# Patient Record
Sex: Male | Born: 1965 | Race: White | Hispanic: No | Smoking: Never smoker
Health system: Southern US, Community
[De-identification: ages and names within clinical notes are randomized; demographics above are authoritative.]

## PROBLEM LIST (undated history)

## (undated) DIAGNOSIS — E7800 Pure hypercholesterolemia, unspecified: Secondary | ICD-10-CM

## (undated) DIAGNOSIS — E079 Disorder of thyroid, unspecified: Secondary | ICD-10-CM

## (undated) DIAGNOSIS — I10 Essential (primary) hypertension: Secondary | ICD-10-CM

## (undated) HISTORY — PX: HAND SURGERY: SHX662

## (undated) HISTORY — DX: Pure hypercholesterolemia, unspecified: E78.00

---

## 2002-09-22 ENCOUNTER — Encounter: Payer: Self-pay | Admitting: Emergency Medicine

## 2002-09-22 ENCOUNTER — Emergency Department (HOSPITAL_COMMUNITY): Admission: EM | Admit: 2002-09-22 | Discharge: 2002-09-22 | Payer: Self-pay | Admitting: Emergency Medicine

## 2004-07-07 ENCOUNTER — Ambulatory Visit (HOSPITAL_COMMUNITY): Admission: RE | Admit: 2004-07-07 | Discharge: 2004-07-07 | Payer: Self-pay | Admitting: Family Medicine

## 2004-11-16 ENCOUNTER — Emergency Department (HOSPITAL_COMMUNITY): Admission: EM | Admit: 2004-11-16 | Discharge: 2004-11-16 | Payer: Self-pay | Admitting: Emergency Medicine

## 2011-01-17 ENCOUNTER — Encounter: Payer: Self-pay | Admitting: Family Medicine

## 2013-06-19 ENCOUNTER — Other Ambulatory Visit (HOSPITAL_COMMUNITY): Payer: Self-pay | Admitting: Family Medicine

## 2013-06-19 ENCOUNTER — Ambulatory Visit (HOSPITAL_COMMUNITY)
Admission: RE | Admit: 2013-06-19 | Discharge: 2013-06-19 | Disposition: A | Discharge status: Home / Self Care | Payer: BC Managed Care – PPO | Source: Ambulatory Visit | Attending: Family Medicine | Admitting: Family Medicine

## 2013-06-19 DIAGNOSIS — E039 Hypothyroidism, unspecified: Secondary | ICD-10-CM

## 2013-06-19 DIAGNOSIS — R079 Chest pain, unspecified: Secondary | ICD-10-CM

## 2013-06-19 DIAGNOSIS — E785 Hyperlipidemia, unspecified: Secondary | ICD-10-CM

## 2013-06-19 DIAGNOSIS — I10 Essential (primary) hypertension: Secondary | ICD-10-CM

## 2013-07-20 ENCOUNTER — Other Ambulatory Visit: Payer: Self-pay | Admitting: *Deleted

## 2013-07-20 DIAGNOSIS — R079 Chest pain, unspecified: Secondary | ICD-10-CM

## 2013-07-20 DIAGNOSIS — I10 Essential (primary) hypertension: Secondary | ICD-10-CM

## 2013-07-24 ENCOUNTER — Telehealth (HOSPITAL_COMMUNITY): Payer: Self-pay | Admitting: Cardiovascular Disease

## 2013-07-26 ENCOUNTER — Encounter: Payer: Self-pay | Admitting: Cardiovascular Disease

## 2013-07-31 ENCOUNTER — Telehealth (HOSPITAL_COMMUNITY): Payer: Self-pay | Admitting: Cardiovascular Disease

## 2013-08-07 ENCOUNTER — Telehealth (HOSPITAL_COMMUNITY): Payer: Self-pay | Admitting: Cardiovascular Disease

## 2013-08-07 NOTE — Telephone Encounter (Signed)
Left message for patient to call back regarding scheduling testing  

## 2013-08-10 ENCOUNTER — Telehealth (HOSPITAL_COMMUNITY): Payer: Self-pay | Admitting: Cardiovascular Disease

## 2013-08-31 ENCOUNTER — Telehealth (HOSPITAL_COMMUNITY): Payer: Self-pay | Admitting: *Deleted

## 2013-09-04 ENCOUNTER — Encounter (HOSPITAL_COMMUNITY): Payer: Self-pay | Admitting: *Deleted

## 2013-09-27 ENCOUNTER — Telehealth (HOSPITAL_COMMUNITY): Payer: Self-pay | Admitting: *Deleted

## 2013-09-27 NOTE — Telephone Encounter (Signed)
[facility] LOC[facility] AT [facility] [address] [place], [place] [address] [phone]   Date:09/27/2013 Dear Dr. Epimenio Sarin  Our office has attempted to contact your patient Donald Perry  twice by telephone and we have also sent an appointment letter to schedule the 2D Echo and Stress test you ordered. The patient has not responded. We will not make any further attempts to contact the patient. If any further assistance is needed for this referral, please contact our office at [phone]  Sincerely, [facility] Scheduling Team

## 2013-10-22 ENCOUNTER — Other Ambulatory Visit (HOSPITAL_COMMUNITY): Payer: Self-pay | Admitting: Family Medicine

## 2013-10-22 DIAGNOSIS — R945 Abnormal results of liver function studies: Secondary | ICD-10-CM

## 2013-10-24 ENCOUNTER — Ambulatory Visit (HOSPITAL_COMMUNITY)
Admission: RE | Admit: 2013-10-24 | Discharge: 2013-10-24 | Disposition: A | Discharge status: Home / Self Care | Payer: BC Managed Care – PPO | Source: Ambulatory Visit | Attending: Family Medicine | Admitting: Family Medicine

## 2013-10-24 DIAGNOSIS — N289 Disorder of kidney and ureter, unspecified: Secondary | ICD-10-CM | POA: Insufficient documentation

## 2013-10-24 DIAGNOSIS — K7689 Other specified diseases of liver: Secondary | ICD-10-CM | POA: Insufficient documentation

## 2013-10-24 DIAGNOSIS — R945 Abnormal results of liver function studies: Secondary | ICD-10-CM

## 2013-10-24 MED ORDER — SODIUM CHLORIDE 0.9 % IJ SOLN
INTRAMUSCULAR | Status: AC
  Filled 2013-10-24: qty 550

## 2013-10-25 ENCOUNTER — Other Ambulatory Visit (HOSPITAL_COMMUNITY): Payer: Self-pay | Admitting: Family Medicine

## 2013-10-25 DIAGNOSIS — Q619 Cystic kidney disease, unspecified: Secondary | ICD-10-CM

## 2013-10-26 ENCOUNTER — Other Ambulatory Visit (HOSPITAL_COMMUNITY): Payer: Self-pay | Admitting: Family Medicine

## 2013-10-30 ENCOUNTER — Inpatient Hospital Stay (HOSPITAL_COMMUNITY): Admission: RE | Admit: 2013-10-30 | Payer: BC Managed Care – PPO | Source: Ambulatory Visit

## 2013-10-30 ENCOUNTER — Ambulatory Visit (HOSPITAL_COMMUNITY)
Admission: RE | Admit: 2013-10-30 | Discharge: 2013-10-30 | Disposition: A | Discharge status: Home / Self Care | Payer: BC Managed Care – PPO | Source: Ambulatory Visit | Attending: Family Medicine | Admitting: Family Medicine

## 2013-10-30 DIAGNOSIS — K7689 Other specified diseases of liver: Secondary | ICD-10-CM | POA: Insufficient documentation

## 2013-10-30 DIAGNOSIS — R160 Hepatomegaly, not elsewhere classified: Secondary | ICD-10-CM | POA: Insufficient documentation

## 2013-10-30 DIAGNOSIS — Q618 Other cystic kidney diseases: Secondary | ICD-10-CM | POA: Insufficient documentation

## 2013-10-30 DIAGNOSIS — Q619 Cystic kidney disease, unspecified: Secondary | ICD-10-CM

## 2013-10-30 LAB — POCT I-STAT CREATININE: Creatinine, Ser: 1.2 mg/dL (ref 0.50–1.35)

## 2013-10-30 MED ORDER — GADOBENATE DIMEGLUMINE 529 MG/ML IV SOLN
20.0000 mL | Freq: Once | INTRAVENOUS | Status: AC | PRN
  Administered 2013-10-30: 20 mL via INTRAVENOUS

## 2013-10-30 MED ORDER — SODIUM CHLORIDE 0.9 % IV SOLN
INTRAVENOUS | Status: AC
  Filled 2013-10-30: qty 150

## 2014-02-08 ENCOUNTER — Emergency Department (HOSPITAL_COMMUNITY): Payer: BC Managed Care – PPO

## 2014-02-08 ENCOUNTER — Encounter (HOSPITAL_COMMUNITY): Payer: Self-pay | Admitting: Emergency Medicine

## 2014-02-08 ENCOUNTER — Emergency Department (HOSPITAL_COMMUNITY)
Admission: EM | Admit: 2014-02-08 | Discharge: 2014-02-08 | Disposition: A | Discharge status: Home / Self Care | Payer: BC Managed Care – PPO | Attending: Emergency Medicine | Admitting: Emergency Medicine

## 2014-02-08 DIAGNOSIS — E079 Disorder of thyroid, unspecified: Secondary | ICD-10-CM | POA: Insufficient documentation

## 2014-02-08 DIAGNOSIS — I10 Essential (primary) hypertension: Secondary | ICD-10-CM | POA: Insufficient documentation

## 2014-02-08 DIAGNOSIS — Y92410 Unspecified street and highway as the place of occurrence of the external cause: Secondary | ICD-10-CM | POA: Insufficient documentation

## 2014-02-08 DIAGNOSIS — Z23 Encounter for immunization: Secondary | ICD-10-CM | POA: Insufficient documentation

## 2014-02-08 DIAGNOSIS — S01512A Laceration without foreign body of oral cavity, initial encounter: Secondary | ICD-10-CM

## 2014-02-08 DIAGNOSIS — Y9389 Activity, other specified: Secondary | ICD-10-CM | POA: Insufficient documentation

## 2014-02-08 DIAGNOSIS — S01511A Laceration without foreign body of lip, initial encounter: Secondary | ICD-10-CM

## 2014-02-08 DIAGNOSIS — Z79899 Other long term (current) drug therapy: Secondary | ICD-10-CM | POA: Insufficient documentation

## 2014-02-08 DIAGNOSIS — S01501A Unspecified open wound of lip, initial encounter: Secondary | ICD-10-CM | POA: Insufficient documentation

## 2014-02-08 HISTORY — DX: Essential (primary) hypertension: I10

## 2014-02-08 HISTORY — DX: Disorder of thyroid, unspecified: E07.9

## 2014-02-08 MED ORDER — AMOXICILLIN 250 MG PO CAPS
1000.0000 mg | ORAL_CAPSULE | Freq: Once | ORAL | Status: AC
  Administered 2014-02-08: 1000 mg via ORAL
  Filled 2014-02-08: qty 4

## 2014-02-08 MED ORDER — TETANUS-DIPHTH-ACELL PERTUSSIS 5-2.5-18.5 LF-MCG/0.5 IM SUSP
0.5000 mL | Freq: Once | INTRAMUSCULAR | Status: AC
Start: 2014-02-08 — End: 2014-02-08
  Administered 2014-02-08: 0.5 mL via INTRAMUSCULAR
  Filled 2014-02-08: qty 0.5

## 2014-02-08 MED ORDER — LIDOCAINE-EPINEPHRINE (PF) 1 %-1:200000 IJ SOLN
10.0000 mg | Freq: Once | INTRAMUSCULAR | Status: AC
  Administered 2014-02-08: 10 mg

## 2014-02-08 MED ORDER — AMOXICILLIN 500 MG PO CAPS
1000.0000 mg | ORAL_CAPSULE | Freq: Two times a day (BID) | ORAL | Status: DC
Start: 2014-02-08 — End: 2024-04-02

## 2014-02-08 NOTE — ED Notes (Signed)
Patient removed collar again but re-applied again
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
## 2014-02-08 NOTE — ED Notes (Signed)
Patient removed his neck collar  And states he wants to  Leave. Re-applied collar and informed him to leave it on and not to take it off. Stated he understood
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
## 2014-02-08 NOTE — Discharge Instructions (Signed)
The stitches on the outside need to be removed in 4-5 days. The stitches inside your mouth will dissolve.  Do not drive after you have been drinking!   Facial Laceration  A facial laceration is a cut on the face. These injuries can be painful and cause bleeding. Lacerations usually heal quickly, but they need special care to reduce scarring. DIAGNOSIS  Your health care provider will take a medical history, ask for details about how the injury occurred, and examine the wound to determine how deep the cut is. TREATMENT  Some facial lacerations may not require closure. Others may not be able to be closed because of an increased risk of infection. The risk of infection and the chance for successful closure will depend on various factors, including the amount of time since the injury occurred. The wound may be cleaned to help prevent infection. If closure is appropriate, pain medicines may be given if needed. Your health care provider will use stitches (sutures), wound glue (adhesive), or skin adhesive strips to repair the laceration. These tools bring the skin edges together to allow for faster healing and a better cosmetic outcome. If needed, you may also be given a tetanus shot. HOME CARE INSTRUCTIONS  Only take over-the-counter or prescription medicines as directed by your health care provider.  Follow your health care provider's instructions for wound care. These instructions will vary depending on the technique used for closing the wound. For Sutures:  Keep the wound clean and dry.   If you were given a bandage (dressing), you should change it at least once a day. Also change the dressing if it becomes wet or dirty, or as directed by your health care provider.   Wash the wound with soap and water 2 times a day. Rinse the wound off with water to remove all soap. Pat the wound dry with a clean towel.   After cleaning, apply a thin layer of the antibiotic ointment recommended by your health  care provider. This will help prevent infection and keep the dressing from sticking.   You may shower as usual after the first 24 hours. Do not soak the wound in water until the sutures are removed.   Get your sutures removed as directed by your health care provider. With facial lacerations, sutures should usually be taken out after 4 5 days to avoid stitch marks.   Wait a few days after your sutures are removed before applying any makeup. For Skin Adhesive Strips:  Keep the wound clean and dry.   Do not get the skin adhesive strips wet. You may bathe carefully, using caution to keep the wound dry.   If the wound gets wet, pat it dry with a clean towel.   Skin adhesive strips will fall off on their own. You may trim the strips as the wound heals. Do not remove skin adhesive strips that are still stuck to the wound. They will fall off in time.  For Wound Adhesive:  You may briefly wet your wound in the shower or bath. Do not soak or scrub the wound. Do not swim. Avoid periods of heavy sweating until the skin adhesive has fallen off on its own. After showering or bathing, gently pat the wound dry with a clean towel.   Do not apply liquid medicine, cream medicine, ointment medicine, or makeup to your wound while the skin adhesive is in place. This may loosen the film before your wound is healed.   If a dressing is placed  over the wound, be careful not to apply tape directly over the skin adhesive. This may cause the adhesive to be pulled off before the wound is healed.   Avoid prolonged exposure to sunlight or tanning lamps while the skin adhesive is in place.  The skin adhesive will usually remain in place for 5 10 days, then naturally fall off the skin. Do not pick at the adhesive film.  After Healing: Once the wound has healed, cover the wound with sunscreen during the day for 1 full year. This can help minimize scarring. Exposure to ultraviolet light in the first year will  darken the scar. It can take 1 2 years for the scar to lose its redness and to heal completely.  SEEK IMMEDIATE MEDICAL CARE IF:  You have redness, pain, or swelling around the wound.   You see ayellowish-white fluid (pus) coming from the wound.   You have chills or a fever.  MAKE SURE YOU:  Understand these instructions.  Will watch your condition.  Will get help right away if you are not doing well or get worse. Document Released: 01/20/2005 Document Revised: 10/03/2013 Document Reviewed: 07/26/2013 [facility] Patient Information 2014 [place], [place].  Mouth Laceration A mouth laceration is a cut inside the mouth. TREATMENT  Because of all the bacteria in the mouth, lacerations are usually not stitched (sutured) unless the wound is gaping open. Sometimes, a couple sutures may be placed just to hold the edges of the wound together and to speed healing. Over the next 1 to 2 days, you will see that the wound edges appear gray in color. The edges may appear ragged and slightly spread apart. Because of all the normal bacteria in the mouth, these wounds are contaminated, but this is not an infection that needs antibiotics. Most wounds heal with no problems despite their appearance. HOME CARE INSTRUCTIONS   Rinse your mouth with a warm, saltwater wash 4 to 6 times per day, or as your caregiver instructs.  Continue oral hygiene and gentle tooth brushing as normal, if possible.  Do not eat or drink hot food or beverages while your mouth is still numb.  Eat a bland diet to avoid irritation from acidic foods.  Only take over-the-counter or prescription medicines for pain, discomfort, or fever as directed by your caregiver.  Follow up with your caregiver as instructed. You may need to see your caregiver for a wound check in 48 to 72 hours to make sure your wound is healing.  If your laceration was sutured, do not play with the sutures or knots with your tongue. If you do this, they will  gradually loosen and may become untied. You may need a tetanus shot if:  You cannot remember when you had your last tetanus shot.  You have never had a tetanus shot. If you get a tetanus shot, your arm may swell, get red, and feel warm to the touch. This is common and not a problem. If you need a tetanus shot and you choose not to have one, there is a rare chance of getting tetanus. Sickness from tetanus can be serious. SEEK MEDICAL CARE IF:   You develop swelling or increasing pain in the wound or in other parts of your face.  You have a fever.  You develop swollen, tender glands in the throat.  You notice the wound edges do not stay together after your sutures have been removed.  You see pus coming from the wound. Some drainage in the mouth  is normal. MAKE SURE YOU:   Understand these instructions.  Will watch your condition.  Will get help right away if you are not doing well or get worse. Document Released: 12/13/2005 Document Revised: 03/06/2012 Document Reviewed: 06/17/2011 [facility] Patient Information 2014 [place], [place].  Tetanus, Diphtheria, Pertussis (Tdap) Vaccine What You Need to Know WHY GET VACCINATED? Tetanus, diphtheria and pertussis can be very serious diseases, even for adolescents and adults. Tdap vaccine can protect [place] from these diseases. TETANUS (Lockjaw) causes painful muscle tightening and stiffness, usually all over the body.  It can lead to tightening of muscles in the head and neck so you can't open your mouth, swallow, or sometimes even breathe. Tetanus kills about 1 out of 5 people who are infected. DIPHTHERIA can cause a thick coating to form in the back of the throat.  It can lead to breathing problems, paralysis, heart failure, and death. PERTUSSIS (Whooping Cough) causes severe coughing spells, which can cause difficulty breathing, vomiting and disturbed sleep.  It can also lead to weight loss, incontinence, and rib fractures. Up to 2 in 100  adolescents and 5 in 100 adults with pertussis are hospitalized or have complications, which could include pneumonia and death. These diseases are caused by bacteria. Diphtheria and pertussis are spread from person to person through coughing or sneezing. Tetanus enters the body through cuts, scratches, or wounds. Before vaccines, the [place] saw as many as 200,000 cases a year of diphtheria and pertussis, and hundreds of cases of tetanus. Since vaccination began, tetanus and diphtheria have dropped by about 99% and pertussis by about 80%. TDAP VACCINE Tdap vaccine can protect adolescents and adults from tetanus, diphtheria, and pertussis. One dose of Tdap is routinely given at age 82 or 23. People who did not get Tdap at that age should get it as soon as possible. Tdap is especially important for health care professionals and anyone having close contact with a baby younger than 12 months. Pregnant women should get a dose of Tdap during every pregnancy, to protect the newborn from pertussis. Infants are most at risk for severe, life-threatening complications from pertussis. A similar vaccine, called Td, protects from tetanus and diphtheria, but not pertussis. A Td booster should be given every 10 years. Tdap may be given as one of these boosters if you have not already gotten a dose. Tdap may also be given after a severe cut or burn to prevent tetanus infection. Your doctor can give you more information. Tdap may safely be given at the same time as other vaccines. SOME PEOPLE SHOULD NOT GET THIS VACCINE  If you ever had a life-threatening allergic reaction after a dose of any tetanus, diphtheria, or pertussis containing vaccine, OR if you have a severe allergy to any part of this vaccine, you should not get Tdap. Tell your doctor if you have any severe allergies.  If you had a coma, or long or multiple seizures within 7 days after a childhood dose of DTP or DTaP, you should not get Tdap, unless a  cause other than the vaccine was found. You can still get Td.  Talk to your doctor if you:  have epilepsy or another nervous system problem,  had severe pain or swelling after any vaccine containing diphtheria, tetanus or pertussis,  ever had Guillain-Barr Syndrome (GBS),  aren't feeling well on the day the shot is scheduled. RISKS OF A VACCINE REACTION With any medicine, including vaccines, there is a chance of side effects. These are usually  mild and go away on their own, but serious reactions are also possible. Brief fainting spells can follow a vaccination, leading to injuries from falling. Sitting or lying down for about 15 minutes can help prevent these. Tell your doctor if you feel dizzy or light-headed, or have vision changes or ringing in the ears. Mild problems following Tdap (Did not interfere with activities)  Pain where the shot was given (about 3 in 4 adolescents or 2 in 3 adults)  Redness or swelling where the shot was given (about 1 person in 5)  Mild fever of at least 100.21F (up to about 1 in 25 adolescents or 1 in 100 adults)  Headache (about 3 or 4 people in 10)  Tiredness (about 1 person in 3 or 4)  Nausea, vomiting, diarrhea, stomach ache (up to 1 in 4 adolescents or 1 in 10 adults)  Chills, body aches, sore joints, rash, swollen glands (uncommon) Moderate problems following Tdap (Interfered with activities, but did not require medical attention)  Pain where the shot was given (about 1 in 5 adolescents or 1 in 100 adults)  Redness or swelling where the shot was given (up to about 1 in 16 adolescents or 1 in 25 adults)  Fever over 102F (about 1 in 100 adolescents or 1 in 250 adults)  Headache (about 3 in 20 adolescents or 1 in 10 adults)  Nausea, vomiting, diarrhea, stomach ache (up to 1 or 3 people in 100)  Swelling of the entire arm where the shot was given (up to about 3 in 100). Severe problems following Tdap (Unable to perform usual activities,  required medical attention)  Swelling, severe pain, bleeding and redness in the arm where the shot was given (rare). A severe allergic reaction could occur after any vaccine (estimated less than 1 in a million doses). WHAT IF THERE IS A SERIOUS REACTION? What should I look for?  Look for anything that concerns you, such as signs of a severe allergic reaction, very high fever, or behavior changes. Signs of a severe allergic reaction can include hives, swelling of the face and throat, difficulty breathing, a fast heartbeat, dizziness, and weakness. These would start a few minutes to a few hours after the vaccination. What should I do?  If you think it is a severe allergic reaction or other emergency that can't wait, call 9-1-1 or get the person to the nearest hospital. Otherwise, call your doctor.  Afterward, the reaction should be reported to the "Vaccine Adverse Event Reporting System" (VAERS). Your doctor might file this report, or you can do it yourself through the VAERS web site at www.vaers.LAgents.no, or by calling 1-207-627-2941. VAERS is only for reporting reactions. They do not give medical advice.  THE NATIONAL VACCINE INJURY COMPENSATION PROGRAM The National Vaccine Injury Compensation Program (VICP) is a federal program that was created to compensate people who may have been injured by certain vaccines. Persons who believe they may have been injured by a vaccine can learn about the program and about filing a claim by calling 1-351-761-9474 or visiting the VICP website at SpiritualWord.at. HOW CAN I LEARN MORE?  Ask your doctor.  Call your local or state health department.  Contact the Centers for Disease Control and Prevention (CDC):  Call 901-084-1353 or visit CDC's website at PicCapture.uy. CDC Tdap Vaccine VIS (05/04/12) Document Released: 06/13/2012 Document Revised: 04/09/2013 Document Reviewed: 04/04/2013 [facility] Patient Information 2014 [place],  [place].  Amoxicillin capsules or tablets What is this medicine? AMOXICILLIN (a mox i SIL  in) is a penicillin antibiotic. It is used to treat certain kinds of bacterial infections. It will not work for colds, flu, or other viral infections. This medicine may be used for other purposes; ask your health care provider or pharmacist if you have questions. COMMON BRAND NAME(S): Amoxil, Moxilin , Sumox, Trimox What should I tell my health care provider before I take this medicine? They need to know if you have any of these conditions: -asthma -kidney disease -an unusual or allergic reaction to amoxicillin, other penicillins, cephalosporin antibiotics, other medicines, foods, dyes, or preservatives -pregnant or trying to get pregnant -breast-feeding How should I use this medicine? Take this medicine by mouth with a glass of water. Follow the directions on your prescription label. You may take this medicine with food or on an empty stomach. Take your medicine at regular intervals. Do not take your medicine more often than directed. Take all of your medicine as directed even if you think your are better. Do not skip doses or stop your medicine early. Talk to your pediatrician regarding the use of this medicine in children. While this drug may be prescribed for selected conditions, precautions do apply. Overdosage: If you think you have taken too much of this medicine contact a poison control center or emergency room at once. NOTE: This medicine is only for you. Do not share this medicine with others. What if I miss a dose? If you miss a dose, take it as soon as you can. If it is almost time for your next dose, take only that dose. Do not take double or extra doses. What may interact with this medicine? -amiloride -birth control pills -chloramphenicol -macrolides -probenecid -sulfonamides -tetracyclines This list may not describe all possible interactions. Give your health care provider a list of all  the medicines, herbs, non-prescription drugs, or dietary supplements you use. Also tell them if you smoke, drink alcohol, or use illegal drugs. Some items may interact with your medicine. What should I watch for while using this medicine? Tell your doctor or health care professional if your symptoms do not improve in 2 or 3 days. Take all of the doses of your medicine as directed. Do not skip doses or stop your medicine early. If you are diabetic, you may get a false positive result for sugar in your urine with certain brands of urine tests. Check with your doctor. Do not treat diarrhea with over-the-counter products. Contact your doctor if you have diarrhea that lasts more than 2 days or if the diarrhea is severe and watery. What side effects may I notice from receiving this medicine? Side effects that you should report to your doctor or health care professional as soon as possible: -allergic reactions like skin rash, itching or hives, swelling of the face, lips, or tongue -breathing problems -dark urine -redness, blistering, peeling or loosening of the skin, including inside the mouth -seizures -severe or watery diarrhea -trouble passing urine or change in the amount of urine -unusual bleeding or bruising -unusually weak or tired -yellowing of the eyes or skin Side effects that usually do not require medical attention (report to your doctor or health care professional if they continue or are bothersome): -dizziness -headache -stomach upset -trouble sleeping This list may not describe all possible side effects. Call your doctor for medical advice about side effects. You may report side effects to FDA at 1-800-FDA-1088. Where should I keep my medicine? Keep out of the reach of children. Store between 68 and 77 degrees F (  20 and 25 degrees C). Keep bottle closed tightly. Throw away any unused medicine after the expiration date. NOTE: This sheet is a summary. It may not cover all possible  information. If you have questions about this medicine, talk to your doctor, pharmacist, or health care provider.  2014, Elsevier/Gold Standard. (2008-03-05 14:10:59)

## 2014-02-08 NOTE — ED Notes (Signed)
Patient in room walking around and refused to remain in bed and removed his neck collar again. Attempted to get him back in bed and replace collar. He refused

## 2014-02-25 ENCOUNTER — Emergency Department (HOSPITAL_COMMUNITY)
Admission: EM | Admit: 2014-02-25 | Discharge: 2014-02-26 | Disposition: A | Discharge status: Home / Self Care | Payer: BC Managed Care – PPO | Attending: Emergency Medicine | Admitting: Emergency Medicine

## 2014-02-25 ENCOUNTER — Emergency Department (HOSPITAL_COMMUNITY): Payer: BC Managed Care – PPO

## 2014-02-25 ENCOUNTER — Encounter (HOSPITAL_COMMUNITY): Payer: Self-pay | Admitting: Emergency Medicine

## 2014-02-25 DIAGNOSIS — E079 Disorder of thyroid, unspecified: Secondary | ICD-10-CM | POA: Insufficient documentation

## 2014-02-25 DIAGNOSIS — M25461 Effusion, right knee: Secondary | ICD-10-CM

## 2014-02-25 DIAGNOSIS — S99919A Unspecified injury of unspecified ankle, initial encounter: Principal | ICD-10-CM

## 2014-02-25 DIAGNOSIS — Y92410 Unspecified street and highway as the place of occurrence of the external cause: Secondary | ICD-10-CM | POA: Insufficient documentation

## 2014-02-25 DIAGNOSIS — S99929A Unspecified injury of unspecified foot, initial encounter: Principal | ICD-10-CM

## 2014-02-25 DIAGNOSIS — Z79899 Other long term (current) drug therapy: Secondary | ICD-10-CM | POA: Insufficient documentation

## 2014-02-25 DIAGNOSIS — I10 Essential (primary) hypertension: Secondary | ICD-10-CM | POA: Insufficient documentation

## 2014-02-25 DIAGNOSIS — M25469 Effusion, unspecified knee: Secondary | ICD-10-CM | POA: Insufficient documentation

## 2014-02-25 DIAGNOSIS — Z792 Long term (current) use of antibiotics: Secondary | ICD-10-CM | POA: Insufficient documentation

## 2014-02-25 DIAGNOSIS — M25561 Pain in right knee: Secondary | ICD-10-CM

## 2014-02-25 DIAGNOSIS — S8990XA Unspecified injury of unspecified lower leg, initial encounter: Secondary | ICD-10-CM | POA: Insufficient documentation

## 2014-02-25 DIAGNOSIS — Y9389 Activity, other specified: Secondary | ICD-10-CM | POA: Insufficient documentation

## 2014-02-25 MED ORDER — OXYCODONE-ACETAMINOPHEN 5-325 MG PO TABS
1.0000 | ORAL_TABLET | Freq: Once | ORAL | Status: AC
  Administered 2014-02-25: 1 via ORAL
  Filled 2014-02-25: qty 1
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
## 2014-02-26 MED ORDER — OXYCODONE-ACETAMINOPHEN 5-325 MG PO TABS: 1.0000 | ORAL_TABLET | ORAL | Status: DC | PRN
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
## 2015-01-14 IMAGING — CR DG CHEST 2V
2 series · 2 of 2 positions shown · non-contrast
Comparison: 07/09/2004

CLINICAL DATA: Chest pain, hyperlipidemia, hypertension, left chest
pain under breast for 1 week

CHEST - 2 VIEW

[view not recorded (1 of 2)]
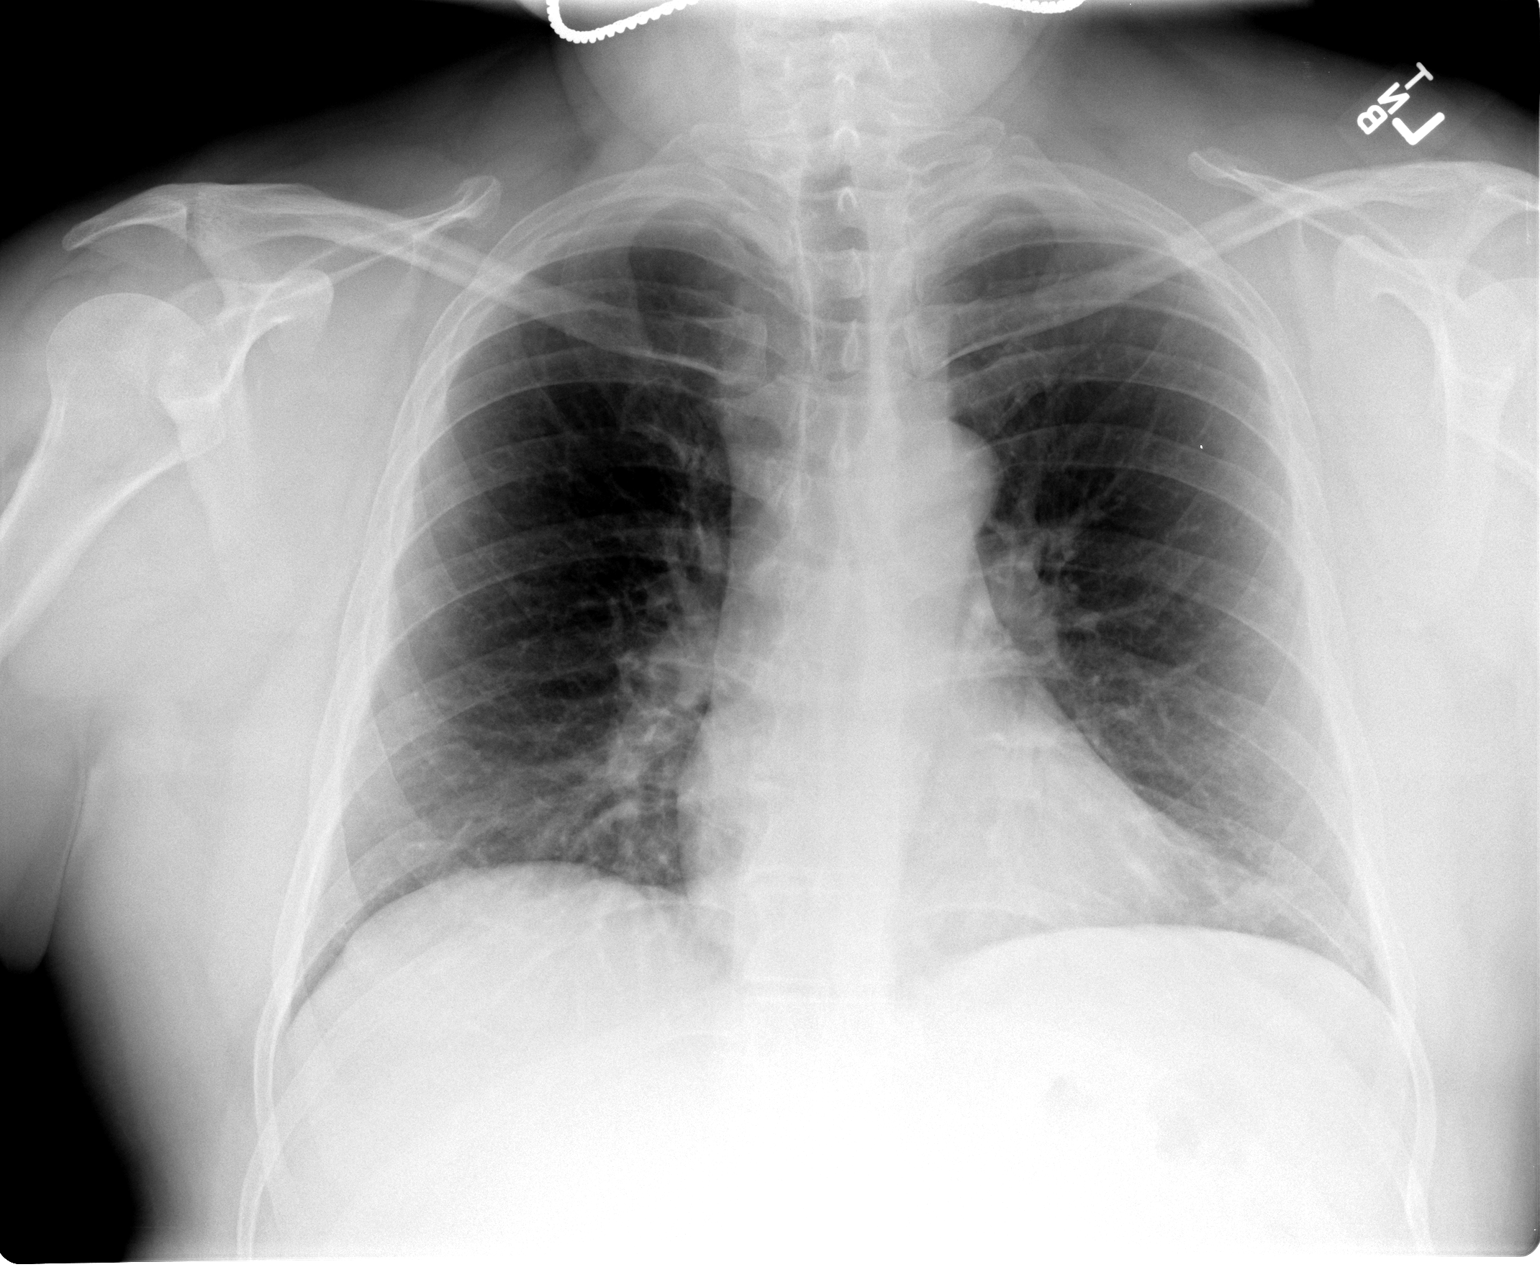

[view not recorded (2 of 2)]
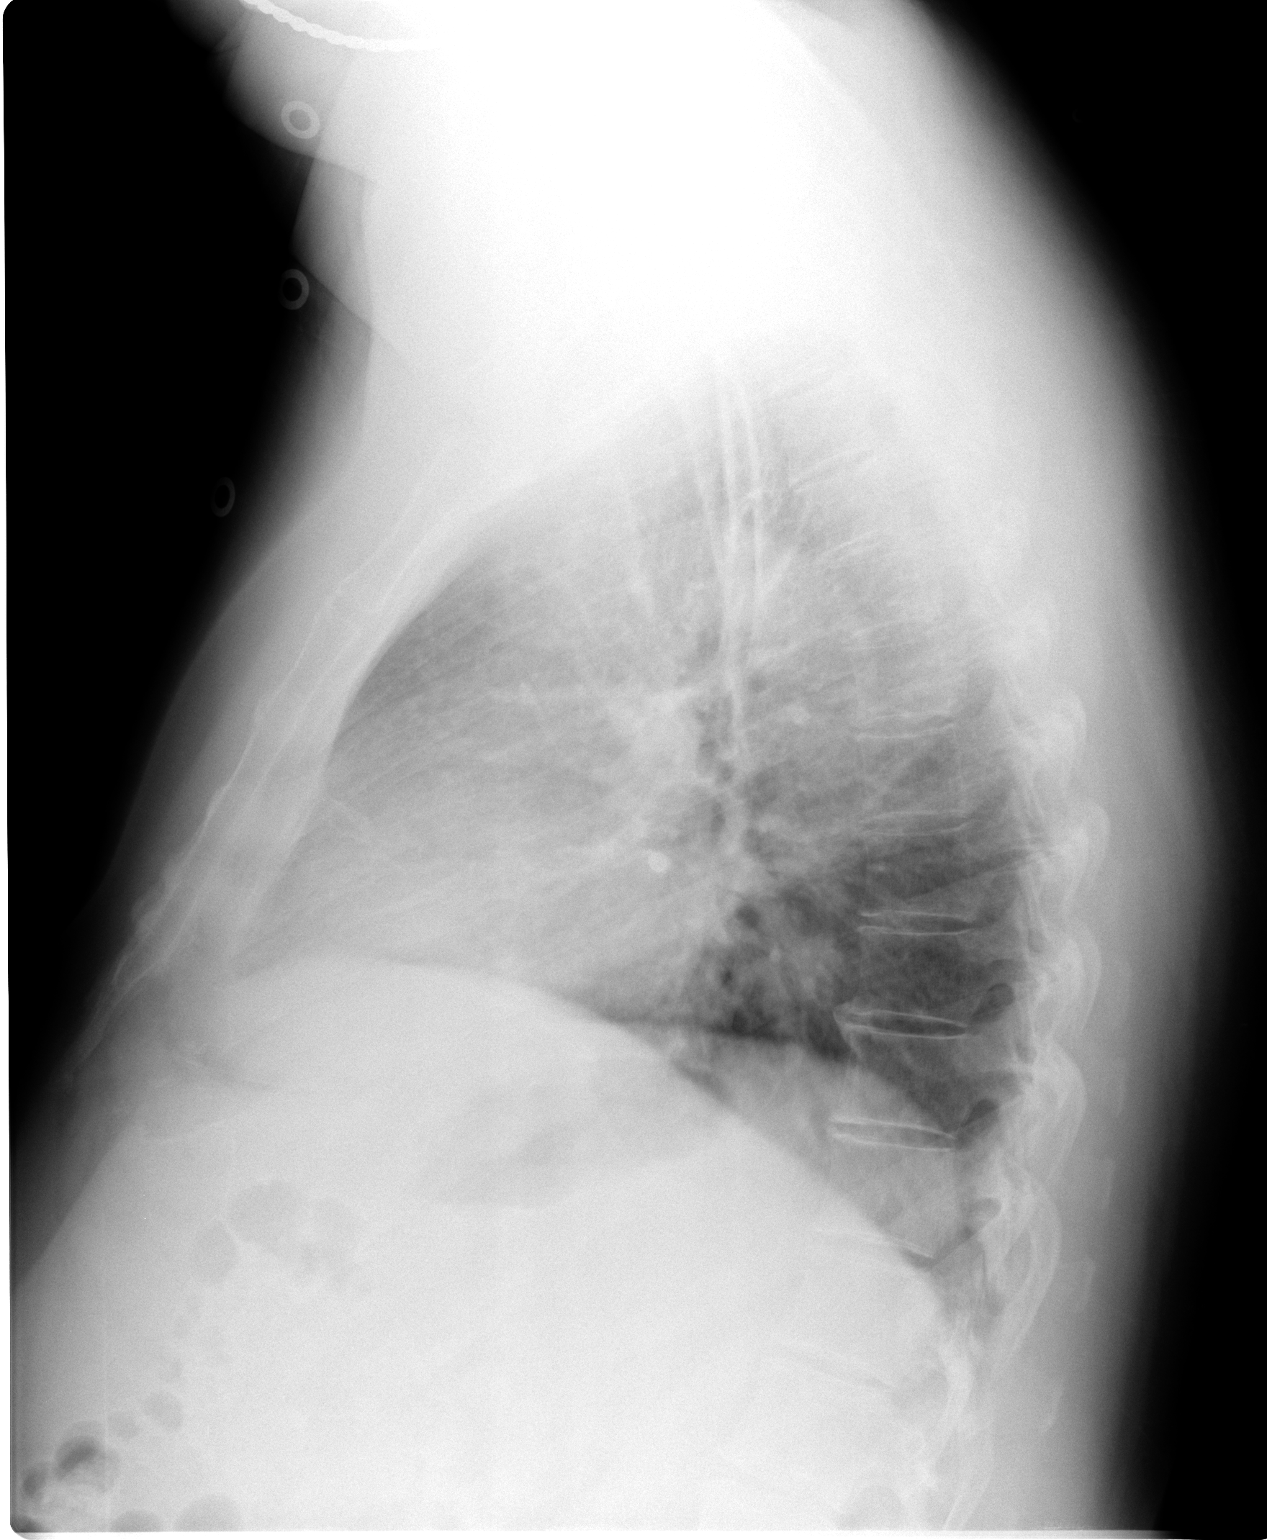

[2 of 2 positions shown; findings below may reference images not displayed]

FINDINGS: Upper-normal size of cardiac silhouette.
Mediastinal contours and pulmonary vascularity normal.
Lungs clear.
No pleural effusion or pneumothorax
Bones unremarkable.
IMPRESSION: No acute abnormalities.

## 2015-07-17 ENCOUNTER — Encounter: Payer: Self-pay | Admitting: *Deleted

## 2015-08-19 ENCOUNTER — Encounter: Payer: Self-pay | Admitting: Cardiovascular Disease

## 2015-11-27 ENCOUNTER — Encounter (INDEPENDENT_AMBULATORY_CARE_PROVIDER_SITE_OTHER): Payer: Self-pay | Admitting: *Deleted

## 2016-01-08 ENCOUNTER — Ambulatory Visit (INDEPENDENT_AMBULATORY_CARE_PROVIDER_SITE_OTHER): Payer: Self-pay | Admitting: Internal Medicine

## 2016-02-04 ENCOUNTER — Ambulatory Visit (INDEPENDENT_AMBULATORY_CARE_PROVIDER_SITE_OTHER): Payer: Self-pay | Admitting: Internal Medicine

## 2016-03-03 ENCOUNTER — Ambulatory Visit (INDEPENDENT_AMBULATORY_CARE_PROVIDER_SITE_OTHER): Payer: Self-pay | Admitting: Internal Medicine

## 2016-03-18 ENCOUNTER — Ambulatory Visit (INDEPENDENT_AMBULATORY_CARE_PROVIDER_SITE_OTHER): Payer: Self-pay | Admitting: Internal Medicine

## 2016-05-10 DIAGNOSIS — E039 Hypothyroidism, unspecified: Secondary | ICD-10-CM | POA: Diagnosis not present

## 2016-05-10 DIAGNOSIS — Z1389 Encounter for screening for other disorder: Secondary | ICD-10-CM | POA: Diagnosis not present

## 2016-05-10 DIAGNOSIS — I10 Essential (primary) hypertension: Secondary | ICD-10-CM | POA: Diagnosis not present

## 2016-05-10 DIAGNOSIS — E782 Mixed hyperlipidemia: Secondary | ICD-10-CM | POA: Diagnosis not present

## 2016-05-10 DIAGNOSIS — Z6828 Body mass index (BMI) 28.0-28.9, adult: Secondary | ICD-10-CM | POA: Diagnosis not present

## 2016-11-11 DIAGNOSIS — E663 Overweight: Secondary | ICD-10-CM | POA: Diagnosis not present

## 2016-11-11 DIAGNOSIS — I10 Essential (primary) hypertension: Secondary | ICD-10-CM | POA: Diagnosis not present

## 2016-11-11 DIAGNOSIS — Z6828 Body mass index (BMI) 28.0-28.9, adult: Secondary | ICD-10-CM | POA: Diagnosis not present

## 2016-11-11 DIAGNOSIS — Z1389 Encounter for screening for other disorder: Secondary | ICD-10-CM | POA: Diagnosis not present

## 2016-11-16 ENCOUNTER — Other Ambulatory Visit (HOSPITAL_COMMUNITY): Payer: Self-pay | Admitting: Family Medicine

## 2016-11-16 ENCOUNTER — Ambulatory Visit (HOSPITAL_COMMUNITY): Payer: BLUE CROSS/BLUE SHIELD

## 2016-11-16 ENCOUNTER — Encounter (HOSPITAL_COMMUNITY): Payer: Self-pay

## 2016-11-16 DIAGNOSIS — R519 Headache, unspecified: Secondary | ICD-10-CM

## 2016-11-16 DIAGNOSIS — I10 Essential (primary) hypertension: Secondary | ICD-10-CM

## 2016-11-16 DIAGNOSIS — R51 Headache: Principal | ICD-10-CM

## 2016-11-16 DIAGNOSIS — Z6828 Body mass index (BMI) 28.0-28.9, adult: Secondary | ICD-10-CM | POA: Diagnosis not present

## 2017-07-27 DIAGNOSIS — K760 Fatty (change of) liver, not elsewhere classified: Secondary | ICD-10-CM | POA: Diagnosis not present

## 2017-07-27 DIAGNOSIS — E559 Vitamin D deficiency, unspecified: Secondary | ICD-10-CM | POA: Diagnosis not present

## 2017-07-27 DIAGNOSIS — R945 Abnormal results of liver function studies: Secondary | ICD-10-CM | POA: Diagnosis not present

## 2017-07-27 DIAGNOSIS — R7309 Other abnormal glucose: Secondary | ICD-10-CM | POA: Diagnosis not present

## 2017-07-27 DIAGNOSIS — E039 Hypothyroidism, unspecified: Secondary | ICD-10-CM | POA: Diagnosis not present

## 2017-07-27 DIAGNOSIS — E782 Mixed hyperlipidemia: Secondary | ICD-10-CM | POA: Diagnosis not present

## 2017-07-27 DIAGNOSIS — Z6827 Body mass index (BMI) 27.0-27.9, adult: Secondary | ICD-10-CM | POA: Diagnosis not present

## 2017-07-27 DIAGNOSIS — Z1389 Encounter for screening for other disorder: Secondary | ICD-10-CM | POA: Diagnosis not present

## 2017-07-27 DIAGNOSIS — E663 Overweight: Secondary | ICD-10-CM | POA: Diagnosis not present

## 2017-07-27 DIAGNOSIS — I10 Essential (primary) hypertension: Secondary | ICD-10-CM | POA: Diagnosis not present

## 2017-10-25 DIAGNOSIS — I10 Essential (primary) hypertension: Secondary | ICD-10-CM | POA: Diagnosis not present

## 2017-10-25 DIAGNOSIS — E039 Hypothyroidism, unspecified: Secondary | ICD-10-CM | POA: Diagnosis not present

## 2017-10-25 DIAGNOSIS — E782 Mixed hyperlipidemia: Secondary | ICD-10-CM | POA: Diagnosis not present

## 2017-10-25 DIAGNOSIS — Z1389 Encounter for screening for other disorder: Secondary | ICD-10-CM | POA: Diagnosis not present

## 2018-01-31 DIAGNOSIS — Z6828 Body mass index (BMI) 28.0-28.9, adult: Secondary | ICD-10-CM | POA: Diagnosis not present

## 2018-01-31 DIAGNOSIS — I10 Essential (primary) hypertension: Secondary | ICD-10-CM | POA: Diagnosis not present

## 2018-01-31 DIAGNOSIS — Z1389 Encounter for screening for other disorder: Secondary | ICD-10-CM | POA: Diagnosis not present

## 2018-01-31 DIAGNOSIS — E663 Overweight: Secondary | ICD-10-CM | POA: Diagnosis not present

## 2018-01-31 DIAGNOSIS — E782 Mixed hyperlipidemia: Secondary | ICD-10-CM | POA: Diagnosis not present

## 2018-06-02 DIAGNOSIS — E663 Overweight: Secondary | ICD-10-CM | POA: Diagnosis not present

## 2018-06-02 DIAGNOSIS — M1000 Idiopathic gout, unspecified site: Secondary | ICD-10-CM | POA: Diagnosis not present

## 2018-06-02 DIAGNOSIS — Z6827 Body mass index (BMI) 27.0-27.9, adult: Secondary | ICD-10-CM | POA: Diagnosis not present

## 2018-06-02 DIAGNOSIS — Z1389 Encounter for screening for other disorder: Secondary | ICD-10-CM | POA: Diagnosis not present

## 2018-06-02 DIAGNOSIS — E039 Hypothyroidism, unspecified: Secondary | ICD-10-CM | POA: Diagnosis not present

## 2019-06-06 DIAGNOSIS — H00015 Hordeolum externum left lower eyelid: Secondary | ICD-10-CM | POA: Diagnosis not present

## 2019-07-25 ENCOUNTER — Other Ambulatory Visit: Payer: Self-pay

## 2019-07-25 ENCOUNTER — Ambulatory Visit: Payer: BLUE CROSS/BLUE SHIELD | Admitting: Orthopaedic Surgery

## 2019-08-14 ENCOUNTER — Ambulatory Visit: Payer: BC Managed Care – PPO | Admitting: Orthopedic Surgery

## 2019-08-21 ENCOUNTER — Ambulatory Visit: Payer: BC Managed Care – PPO | Admitting: Orthopedic Surgery

## 2021-04-07 ENCOUNTER — Other Ambulatory Visit (HOSPITAL_COMMUNITY): Payer: Self-pay | Admitting: Family Medicine

## 2021-04-07 DIAGNOSIS — R7989 Other specified abnormal findings of blood chemistry: Secondary | ICD-10-CM

## 2021-04-07 DIAGNOSIS — R945 Abnormal results of liver function studies: Secondary | ICD-10-CM

## 2021-04-07 DIAGNOSIS — K760 Fatty (change of) liver, not elsewhere classified: Secondary | ICD-10-CM

## 2021-11-04 ENCOUNTER — Ambulatory Visit (INDEPENDENT_AMBULATORY_CARE_PROVIDER_SITE_OTHER): Payer: BC Managed Care – PPO | Admitting: Orthopaedic Surgery

## 2021-11-04 ENCOUNTER — Encounter: Payer: Self-pay | Admitting: Orthopaedic Surgery

## 2021-11-04 VITALS — Ht 69.0 in | Wt 205.0 lb

## 2021-11-04 DIAGNOSIS — G8929 Other chronic pain: Secondary | ICD-10-CM

## 2021-11-04 DIAGNOSIS — M25562 Pain in left knee: Secondary | ICD-10-CM

## 2021-11-04 DIAGNOSIS — M10062 Idiopathic gout, left knee: Secondary | ICD-10-CM | POA: Diagnosis not present

## 2021-11-04 MED ORDER — HYDROCODONE-ACETAMINOPHEN 5-325 MG PO TABS: 1.0000 | ORAL_TABLET | ORAL | 0 refills | Status: AC | PRN

## 2021-11-04 MED ORDER — ALLOPURINOL 300 MG PO TABS: 300.0000 mg | ORAL_TABLET | Freq: Every day | ORAL | 5 refills | Status: DC

## 2021-11-04 NOTE — Progress Notes (Signed)
Subjective:    Patient ID: Donald Perry, male    DOB: [birth year], 55 y.o.   MRN: [id number]  HPI He has long history of gout treated with colchicine.  He has not been on allopurinol or Uloric.  He has had gout attack of the right great toe in late July, early August.  Then gout of the right ankle late August.  Over the last several days he has had swelling of the left knee with no apparent trauma.  He was to get lab work done later this week.  He has no trauma.  He has history of gout in the family.  He has marked pain when the gout "hits."    He has tried tylenol and Advil with no help.    Review of Systems  Constitutional:  Positive for activity change.  Musculoskeletal:  Positive for arthralgias, gait problem, joint swelling and myalgias.  All other systems reviewed and are negative. For Review of Systems, all other systems reviewed and are negative.  The following is a summary of the past history medically, past history surgically, known current medicines, social history and family history.  This information is gathered electronically by the computer from prior information and documentation.  I review this each visit and have found including this information at this point in the chart is beneficial and informative.   Past Medical History:  Diagnosis Date   High cholesterol    Hypertension    Thyroid disease     Past Surgical History:  Procedure Laterality Date   HAND SURGERY      Current Outpatient Medications on File Prior to Visit  Medication Sig Dispense Refill   amoxicillin (AMOXIL) 500 MG capsule Take 2 capsules (1,000 mg total) by mouth 2 (two) times daily. 20 capsule 0   levothyroxine (SYNTHROID, LEVOTHROID) 100 MCG tablet Take 100 mcg by mouth daily before breakfast.     olmesartan (BENICAR) 20 MG tablet Take 10 mg by mouth daily.     oxyCODONE-acetaminophen (PERCOCET/ROXICET) 5-325 MG per tablet Take 1 tablet by mouth every 4 (four) hours as needed for severe  pain. 5 tablet 0   No current facility-administered medications on file prior to visit.    Social History   Socioeconomic History   Marital status: Divorced    Spouse name: Not on file   Number of children: Not on file   Years of education: Not on file   Highest education level: Not on file  Occupational History   Not on file  Tobacco Use   Smoking status: Never   Smokeless tobacco: Never  Substance and Sexual Activity   Alcohol use: Yes    Alcohol/week: 10.0 standard drinks    Types: 10 Cans of beer per week   Drug use: Not on file   Sexual activity: Not on file  Other Topics Concern   Not on file  Social History Narrative   Not on file   Social Determinants of Health   Financial Resource Strain: Not on file  Food Insecurity: Not on file  Transportation Needs: Not on file  Physical Activity: Not on file  Stress: Not on file  Social Connections: Not on file  Intimate Partner Violence: Not on file    Family History  Problem Relation Age of Onset   Hypertension Mother    Diabetes Father    Heart attack Maternal Grandmother    Liver disease Maternal Grandfather    Heart attack Paternal Grandfather  Ht 5\' 9"  (1.753 m)   Wt 205 lb (93 kg)   BMI 30.27 kg/m   Body mass index is 30.27 kg/m.     Objective:   Physical Exam Vitals and nursing note reviewed. Exam conducted with a chaperone present.  Constitutional:      Appearance: He is well-developed.  HENT:     Head: Normocephalic and atraumatic.  Eyes:     Conjunctiva/sclera: Conjunctivae normal.     Pupils: Pupils are equal, round, and reactive to light.  Cardiovascular:     Rate and Rhythm: Normal rate and regular rhythm.  Pulmonary:     Effort: Pulmonary effort is normal.  Abdominal:     Palpations: Abdomen is soft.  Musculoskeletal:     Cervical back: Normal range of motion and neck supple.       Legs:  Skin:    General: Skin is warm and dry.  Neurological:     Mental Status: He is alert  and oriented to person, place, and time.     Cranial Nerves: No cranial nerve deficit.     Motor: No abnormal muscle tone.     Coordination: Coordination normal.     Deep Tendon Reflexes: Reflexes are normal and symmetric. Reflexes normal.  Psychiatric:        Behavior: Behavior normal.        Thought Content: Thought content normal.        Judgment: Judgment normal.  X-rays were done of the left knee showing tricompartmental changes and spurring of the patella.  No fracture or loose body noted.  Bone quality is good.        Assessment & Plan:   Encounter Diagnoses  Name Primary?   Chronic pain of left knee Yes   Acute idiopathic gout of left knee    I have taken time to explain to him about gout, how he inherited it and treatment plans.  The colchicine helps with the pain but he needs allopurinol added and to take it daily.  He appears to understand.  He will need to take it daily from now on.  PROCEDURE NOTE:  The patient request injection, verbal consent was obtained.  The left knee was prepped appropriately after time out was performed.   Sterile technique was observed and anesthesia was provided by ethyl chloride and a 20-gauge needle was used to inject the knee area.  A 16-gauge needle was then used to aspirate the knee.  Color of fluid aspirated was nontransparent yellow tinged fluid  Total cc's aspirated was 50.    Injection of 1 cc of DepoMedrol 40 mg with several cc's of plain xylocaine was then performed.  A band aid dressing was applied.  The patient was advised to apply ice later today and tomorrow to the injection sight as needed.   I will call in allopurinol and pain medicine.  I have ordered serum uric acid.  I have ordered the joint fluid be examined for cell count and crystals.  I have reviewed the Controlled Substance Reporting System web site prior to prescribing narcotic medicine for this patient.  Return in two weeks.  Call if  any problem.  Precautions discussed.  Electronically Signed [place], MD 11/9/202210:59 AM

## 2021-11-18 ENCOUNTER — Ambulatory Visit: Payer: BC Managed Care – PPO | Admitting: Orthopaedic Surgery

## 2023-11-30 ENCOUNTER — Other Ambulatory Visit (HOSPITAL_COMMUNITY): Payer: Self-pay | Admitting: Family Medicine

## 2023-11-30 DIAGNOSIS — Z1389 Encounter for screening for other disorder: Secondary | ICD-10-CM

## 2024-01-04 ENCOUNTER — Ambulatory Visit (HOSPITAL_COMMUNITY): Admission: RE | Admit: 2024-01-04 | Payer: Self-pay | Source: Ambulatory Visit

## 2024-01-04 ENCOUNTER — Encounter (HOSPITAL_COMMUNITY): Payer: Self-pay

## 2024-01-26 ENCOUNTER — Ambulatory Visit (HOSPITAL_COMMUNITY)
Admission: RE | Admit: 2024-01-26 | Discharge: 2024-01-26 | Disposition: A | Discharge status: Home / Self Care | Payer: Self-pay | Source: Ambulatory Visit | Attending: Family Medicine | Admitting: Family Medicine

## 2024-01-26 DIAGNOSIS — Z1389 Encounter for screening for other disorder: Secondary | ICD-10-CM | POA: Insufficient documentation

## 2024-03-28 NOTE — Progress Notes (Signed)
 Updated medications

## 2024-03-29 ENCOUNTER — Ambulatory Visit: Payer: Self-pay | Admitting: Internal Medicine

## 2024-04-02 ENCOUNTER — Encounter: Payer: Self-pay | Admitting: Internal Medicine

## 2024-04-02 ENCOUNTER — Ambulatory Visit: Payer: Self-pay | Attending: Internal Medicine | Admitting: Internal Medicine

## 2024-04-02 VITALS — BP 182/100 | HR 55 | Ht 69.0 in | Wt 207.0 lb

## 2024-04-02 DIAGNOSIS — Z136 Encounter for screening for cardiovascular disorders: Secondary | ICD-10-CM

## 2024-04-02 DIAGNOSIS — E785 Hyperlipidemia, unspecified: Secondary | ICD-10-CM | POA: Insufficient documentation

## 2024-04-02 DIAGNOSIS — I10 Essential (primary) hypertension: Secondary | ICD-10-CM | POA: Diagnosis not present

## 2024-04-02 DIAGNOSIS — E782 Mixed hyperlipidemia: Secondary | ICD-10-CM

## 2024-04-02 DIAGNOSIS — R931 Abnormal findings on diagnostic imaging of heart and coronary circulation: Secondary | ICD-10-CM | POA: Insufficient documentation

## 2024-04-02 MED ORDER — ASPIRIN 81 MG PO TBEC: 81.0000 mg | DELAYED_RELEASE_TABLET | Freq: Every day | ORAL | 3 refills | Status: AC

## 2024-04-02 MED ORDER — ROSUVASTATIN CALCIUM 10 MG PO TABS: 10.0000 mg | ORAL_TABLET | Freq: Every day | ORAL | 5 refills | Status: DC

## 2024-04-02 MED ORDER — CHLORTHALIDONE 25 MG PO TABS: 25.0000 mg | ORAL_TABLET | Freq: Every day | ORAL | 5 refills | Status: AC

## 2024-04-02 NOTE — Progress Notes (Signed)
 Cardiology Office Note  Date: 04/02/2024   ID: Donald Perry, DOB [birth year], MRN [id number]  PCP:  Assunta Found, MD  Cardiologist:  Marjo Bicker, MD Electrophysiologist:  None   History of Present Illness: Donald Perry is a 58 y.o. male known to have HTN, HLD was referred to cardiology clinic for evaluation of elevated coronary calcium score.  CT calcium scoring test was performed in January 2025 that showed coronary calcium score of 446.  93% for age and sex well-controlled.  LM 80 and LAD 366.  Patient walks for about 30 minutes to 1 hour daily.  Does not have any symptoms of angina or DOE.  He does get occasional chest pains at rest and thinks this is from gas.  No dizziness, syncope, leg swelling, palpitations.  Past Medical History:  Diagnosis Date   High cholesterol    Hypertension    Thyroid disease     Past Surgical History:  Procedure Laterality Date   HAND SURGERY      Current Outpatient Medications  Medication Sig Dispense Refill   allopurinol (ZYLOPRIM) 300 MG tablet Take 1 tablet (300 mg total) by mouth daily. 30 tablet 5   amLODipine (NORVASC) 10 MG tablet Take 10 mg by mouth daily.     amoxicillin (AMOXIL) 500 MG capsule Take 2 capsules (1,000 mg total) by mouth 2 (two) times daily. 20 capsule 0   levothyroxine (SYNTHROID, LEVOTHROID) 100 MCG tablet Take 100 mcg by mouth daily before breakfast.     olmesartan (BENICAR) 20 MG tablet Take 10 mg by mouth daily.     oxyCODONE-acetaminophen (PERCOCET/ROXICET) 5-325 MG per tablet Take 1 tablet by mouth every 4 (four) hours as needed for severe pain. 5 tablet 0   No current facility-administered medications for this visit.   Allergies:  Other   Social History: The patient  reports that he has never smoked. He has never used smokeless tobacco. He reports current alcohol use of about 10.0 standard drinks of alcohol per week.   Family History: The patient's family history includes Diabetes in his  father; Heart attack in his maternal grandmother and paternal grandfather; Hypertension in his mother; Liver disease in his maternal grandfather.   ROS:  Please see the history of present illness. Otherwise, complete review of systems is positive for none  All other systems are reviewed and negative.   Physical Exam: VS:  There were no vitals taken for this visit., BMI There is no height or weight on file to calculate BMI.  Wt Readings from Last 3 Encounters:  11/04/21 205 lb (93 kg)  02/25/14 215 lb (97.5 kg)  02/08/14 215 lb (97.5 kg)    General: Patient appears comfortable at rest. HEENT: Conjunctiva and lids normal, oropharynx clear with moist mucosa. Neck: Supple, no elevated JVP or carotid bruits, no thyromegaly. Lungs: Clear to auscultation, nonlabored breathing at rest. Cardiac: Regular rate and rhythm, no S3 or significant systolic murmur, no pericardial rub. Abdomen: Soft, nontender, no hepatomegaly, bowel sounds present, no guarding or rebound. Extremities: No pitting edema, distal pulses 2+. Skin: Warm and dry. Musculoskeletal: No kyphosis. Neuropsychiatric: Alert and oriented x3, affect grossly appropriate.  Recent Labwork: No results found for requested labs within last 365 days.  No results found for: "CHOL", "TRIG", "HDL", "CHOLHDL", "VLDL", "LDLCALC", "LDLDIRECT"   Assessment and Plan:  Elevated coronary calcium score: CT calcium scoring test in January 2025 showed elevated coronary calcium score of 446, 93rd percentile for age and sex matched  control. LM 80 and LAD 366. No angina or DOE. Walks for about 30 minutes to 1 hour daily with no issues. No indication of stress testing at this time. I discussed with him the symptoms of CAD and MI.  Provided ER precautions for chest pain. If he develops any new angina or DOE, he will need stress testing for further evaluation. He verbalized understanding of this plan. For elevated coronary calcium score, will start aspirin 81 mg  once daily and rosuvastatin 10 mg nightly.  His blood pressures are poorly controlled with home BP ranging around 150-190 mmHg SBP.  I discussed with him the importance of aggressive BP control and preventing CAD.  HLD, not at goal: LDL 147 in December 2024.  He was previously started on statin but discontinued due to leg swelling.  Will start rosuvastatin 10 mg nightly.  Can hold off on Zetia for now.  Goal LDL less than 70.  HTN, poorly controlled: Home BP ranges between 150 and 190 mm Hg SBP.  Currently on olmesartan 40 mg once daily.  He previously was on amlodipine but self discontinued due to severe leg swelling.  Will start chlorthalidone 20 mg once daily.  Strongly encouraged him to make an appointment with PCP and get his HTN controlled.      Medication Adjustments/Labs and Tests Ordered: Current medicines are reviewed at length with the patient today.  Concerns regarding medicines are outlined above.    Disposition:  Follow up  1 year  Signed Mckaylie Vasey Verne Spurr, MD, 04/02/2024 2:11 PM    [facility] at [facility] [address] [place], [place], [place] [address]

## 2024-04-02 NOTE — Patient Instructions (Signed)
 Medication Instructions:  Your physician has recommended you make the following change in your medication:  Start Chlorthalidone 25 mg once daily  Rosuvastatin 10 mg daily at bed time  Aspirin 81 mg once daily  Continue taking all other medications as prescribed  Labwork: none  Testing/Procedures: None  Follow-Up: Your physician recommends that you schedule a follow-up appointment in: 1 year You will receive a reminder call in about 8 months reminding you to schedule your appointment. If you don't receive this call, please contact our office.   Any Other Special Instructions Will Be Listed Below (If Applicable). Thank you for choosing [facility]!     If you need a refill on your cardiac medications before your next appointment, please call your pharmacy.

## 2024-08-17 ENCOUNTER — Encounter: Payer: Self-pay | Admitting: Radiology

## 2024-10-26 ENCOUNTER — Other Ambulatory Visit: Payer: Self-pay | Admitting: Internal Medicine

## 2024-10-29 ENCOUNTER — Encounter: Payer: Self-pay | Admitting: Radiology
